# Patient Record
Sex: Female | Born: 2000 | Race: White | Hispanic: No | Marital: Single
Health system: Southern US, Community
[De-identification: ages and names within clinical notes are randomized; demographics above are authoritative.]

---

## 2020-01-24 ENCOUNTER — Encounter (HOSPITAL_BASED_OUTPATIENT_CLINIC_OR_DEPARTMENT_OTHER): Payer: Self-pay | Admitting: Emergency Medicine

## 2020-01-24 ENCOUNTER — Emergency Department (HOSPITAL_BASED_OUTPATIENT_CLINIC_OR_DEPARTMENT_OTHER)
Admission: EM | Admit: 2020-01-24 | Discharge: 2020-01-24 | Disposition: A | Discharge status: Home / Self Care | Payer: BLUE CROSS/BLUE SHIELD | Attending: Emergency Medicine | Admitting: Emergency Medicine

## 2020-01-24 ENCOUNTER — Emergency Department (HOSPITAL_BASED_OUTPATIENT_CLINIC_OR_DEPARTMENT_OTHER): Payer: BLUE CROSS/BLUE SHIELD

## 2020-01-24 ENCOUNTER — Other Ambulatory Visit: Payer: Self-pay

## 2020-01-24 DIAGNOSIS — R319 Hematuria, unspecified: Secondary | ICD-10-CM | POA: Diagnosis not present

## 2020-01-24 DIAGNOSIS — N201 Calculus of ureter: Secondary | ICD-10-CM | POA: Diagnosis not present

## 2020-01-24 DIAGNOSIS — D72829 Elevated white blood cell count, unspecified: Secondary | ICD-10-CM | POA: Insufficient documentation

## 2020-01-24 DIAGNOSIS — R112 Nausea with vomiting, unspecified: Secondary | ICD-10-CM | POA: Diagnosis not present

## 2020-01-24 DIAGNOSIS — R1031 Right lower quadrant pain: Secondary | ICD-10-CM | POA: Diagnosis present

## 2020-01-24 LAB — COMPREHENSIVE METABOLIC PANEL
ALT: 39 U/L (ref 0–44)
AST: 39 U/L (ref 15–41)
Albumin: 4.9 g/dL (ref 3.5–5.0)
Alkaline Phosphatase: 48 U/L (ref 38–126)
Anion gap: 13 (ref 5–15)
BUN: 16 mg/dL (ref 6–20)
CO2: 22 mmol/L (ref 22–32)
Calcium: 9.7 mg/dL (ref 8.9–10.3)
Chloride: 103 mmol/L (ref 98–111)
Creatinine, Ser: 0.96 mg/dL (ref 0.44–1.00)
GFR calc Af Amer: >60 mL/min (ref >60)
GFR calc non Af Amer: >60 mL/min (ref >60)
Glucose, Bld: 132 mg/dL — ABNORMAL HIGH (ref 70–99)
Potassium: 4.2 mmol/L (ref 3.5–5.1)
Sodium: 138 mmol/L (ref 135–145)
Total Bilirubin: 0.4 mg/dL (ref 0.3–1.2)
Total Protein: 8.1 g/dL (ref 6.5–8.1)

## 2020-01-24 LAB — CBC
HCT: 39.3 % (ref 36.0–46.0)
Hemoglobin: 12.6 g/dL (ref 12.0–15.0)
MCH: 28.6 pg (ref 26.0–34.0)
MCHC: 32.1 g/dL (ref 30.0–36.0)
MCV: 89.1 fL (ref 80.0–100.0)
Platelets: 164 10*3/uL (ref 150–400)
RBC: 4.41 MIL/uL (ref 3.87–5.11)
RDW: 13.3 % (ref 11.5–15.5)
WBC: 11.3 10*3/uL — ABNORMAL HIGH (ref 4.0–10.5)
nRBC: 0 % (ref 0.0–0.2)

## 2020-01-24 LAB — URINALYSIS, ROUTINE W REFLEX MICROSCOPIC
Bilirubin Urine: NEGATIVE
Glucose, UA: NEGATIVE mg/dL
Ketones, ur: 15 mg/dL — AB
Leukocytes,Ua: NEGATIVE
Nitrite: NEGATIVE
Protein, ur: 30 mg/dL — AB
Specific Gravity, Urine: 1.025 (ref 1.005–1.030)
pH: 7 (ref 5.0–8.0)

## 2020-01-24 LAB — URINALYSIS, MICROSCOPIC (REFLEX): RBC / HPF: >50 RBC/hpf (ref 0–5)

## 2020-01-24 LAB — PREGNANCY, URINE: Preg Test, Ur: NEGATIVE

## 2020-01-24 LAB — LIPASE, BLOOD: Lipase: 31 U/L (ref 11–51)

## 2020-01-24 MED ORDER — ONDANSETRON HCL 4 MG/2ML IJ SOLN
4.0000 mg | Freq: Once | INTRAMUSCULAR | Status: AC
  Administered 2020-01-24: 4 mg via INTRAVENOUS
  Filled 2020-01-24: qty 2

## 2020-01-24 MED ORDER — HYDROCODONE-ACETAMINOPHEN 5-325 MG PO TABS: 1.0000 | ORAL_TABLET | ORAL | 0 refills | Status: AC | PRN

## 2020-01-24 MED ORDER — ONDANSETRON 4 MG PO TBDP: 4.0000 mg | ORAL_TABLET | Freq: Three times a day (TID) | ORAL | 0 refills | Status: AC | PRN

## 2020-01-24 MED ORDER — KETOROLAC TROMETHAMINE 15 MG/ML IJ SOLN
15.0000 mg | Freq: Once | INTRAMUSCULAR | Status: AC
  Administered 2020-01-24: 15 mg via INTRAVENOUS
  Filled 2020-01-24: qty 1

## 2020-01-24 MED ORDER — MORPHINE SULFATE (PF) 4 MG/ML IV SOLN
4.0000 mg | Freq: Once | INTRAVENOUS | Status: DC
  Filled 2020-01-24: qty 1

## 2020-01-24 MED ORDER — SODIUM CHLORIDE 0.9 % IV BOLUS
500.0000 mL | Freq: Once | INTRAVENOUS | Status: AC
  Administered 2020-01-24: 500 mL via INTRAVENOUS

## 2020-01-24 MED ORDER — SODIUM CHLORIDE 0.9% FLUSH
3.0000 mL | Freq: Once | INTRAVENOUS | Status: DC
  Filled 2020-01-24: qty 3

## 2020-01-24 NOTE — ED Notes (Signed)
Pt states more comfortable following toradol, pain improved to 5/10, nausea improved

## 2020-01-24 NOTE — ED Provider Notes (Signed)
[facility] EMERGENCY DEPARTMENT Provider Note   CSN: [id number] Arrival date & time: 01/24/20  1319     History Chief Complaint  Patient presents with  . Abdominal Pain    Crystal Davis is a 19 y.o. female.  HPI 19 year old female presents with RLQ pain.  Started this morning and has been constant.  Seems to be worsening.  It is a sharp pain.  It goes into her right flank.  Vomited once this afternoon.  Tried to take ibuprofen but thinks she vomited it up.  No fevers, back pain, or urinary symptoms.  Pain is a 9/10.  History reviewed. No pertinent past medical history.  There are no problems to display for this patient.   History reviewed. No pertinent surgical history.   OB History   No obstetric history on file.     No family history on file.  Social History   Tobacco Use  . Smoking status: Not on file  Substance Use Topics  . Alcohol use: Not on file  . Drug use: Not on file    Home Medications Prior to Admission medications   Medication Sig Start Date End Date Taking? Authorizing Provider  HYDROcodone-acetaminophen (NORCO) 5-325 MG tablet Take 1 tablet by mouth every 4 (four) hours as needed for severe pain. 01/24/20   Pricilla Loveless, MD  ondansetron (ZOFRAN ODT) 4 MG disintegrating tablet Take 1 tablet (4 mg total) by mouth every 8 (eight) hours as needed. 01/24/20   Pricilla Loveless, MD    Allergies    Patient has no known allergies.  Review of Systems   Review of Systems  Constitutional: Negative for fever.  Gastrointestinal: Positive for abdominal pain, nausea and vomiting.  Genitourinary: Positive for flank pain. Negative for dysuria and hematuria.  Musculoskeletal: Negative for back pain.  All other systems reviewed and are negative.   Physical Exam Updated Vital Signs BP 114/76 (BP Location: Right Arm)   Pulse 77   Temp 98.5 F (36.9 C) (Oral)   Resp 16   Ht 5\' 2"  (1.575 m)   Wt 46.7 kg   LMP 01/11/2020 (Exact Date)   SpO2 95%    BMI 18.84 kg/m   Physical Exam Vitals and nursing note reviewed.  Constitutional:      General: She is not in acute distress.    Appearance: She is well-developed. She is not ill-appearing or diaphoretic.  HENT:     Head: Normocephalic and atraumatic.     Right Ear: External ear normal.     Left Ear: External ear normal.     Nose: Nose normal.  Eyes:     General:        Right eye: No discharge.        Left eye: No discharge.  Cardiovascular:     Rate and Rhythm: Normal rate and regular rhythm.     Heart sounds: Normal heart sounds.  Pulmonary:     Effort: Pulmonary effort is normal.     Breath sounds: Normal breath sounds.  Abdominal:     Palpations: Abdomen is soft.     Tenderness: There is no abdominal tenderness. There is right CVA tenderness.  Skin:    General: Skin is warm and dry.  Neurological:     Mental Status: She is alert.  Psychiatric:        Mood and Affect: Mood is not anxious.     ED Results / Procedures / Treatments   Labs (all labs ordered are  listed, but only abnormal results are displayed) Labs Reviewed  COMPREHENSIVE METABOLIC PANEL - Abnormal; Notable for the following components:      Result Value   Glucose, Bld 132 (*)    All other components within normal limits  CBC - Abnormal; Notable for the following components:   WBC 11.3 (*)    All other components within normal limits  URINALYSIS, ROUTINE W REFLEX MICROSCOPIC - Abnormal; Notable for the following components:   APPearance HAZY (*)    Hgb urine dipstick LARGE (*)    Ketones, ur 15 (*)    Protein, ur 30 (*)    All other components within normal limits  URINALYSIS, MICROSCOPIC (REFLEX) - Abnormal; Notable for the following components:   Bacteria, UA MANY (*)    All other components within normal limits  URINE CULTURE  LIPASE, BLOOD  PREGNANCY, URINE    EKG None  Radiology CT Renal Stone Study  Result Date: 01/24/2020 CLINICAL DATA:  Right lower quadrant pain EXAM: CT  ABDOMEN AND PELVIS WITHOUT CONTRAST TECHNIQUE: Multidetector CT imaging of the abdomen and pelvis was performed following the standard protocol without IV contrast. COMPARISON:  None. FINDINGS: Lower chest: No acute abnormality. Hepatobiliary: Possible sludge within the gallbladder. No calcified stone or biliary dilatation. Pancreas: Unremarkable. No pancreatic ductal dilatation or surrounding inflammatory changes. Spleen: Normal in size without focal abnormality. Adrenals/Urinary Tract: Adrenal glands are normal. Slight hyperdensity at the renal pyramids. Mild right hydronephrosis and hydroureter, suspected to be secondary to probable 2 mm stone in the distal right ureter about a cm proximal to the right UVJ. Urinary bladder otherwise unremarkable. Stomach/Bowel: Stomach is within normal limits. Appendix appears normal. No evidence of bowel wall thickening, distention, or inflammatory changes. Vascular/Lymphatic: No significant vascular findings are present. No enlarged abdominal or pelvic lymph nodes. Reproductive: Uterus and bilateral adnexa are unremarkable. Other: No free air.  Small free fluid in the pelvis Musculoskeletal: No acute or significant osseous findings. IMPRESSION: 1. Mild right hydronephrosis and hydroureter, suspected to be secondary to probable 2 mm stone in the distal right ureter about a cm proximal to the right UVJ. 2. Small free fluid in the pelvis. Electronically Signed   By: Jasmine Pang M.D.   On: 01/24/2020 16:38    Procedures Procedures (including critical care time)  Medications Ordered in ED Medications  sodium chloride flush (NS) 0.9 % injection 3 mL (3 mLs Intravenous Not Given 01/24/20 1806)  morphine 4 MG/ML injection 4 mg (0 mg Intravenous Hold 01/24/20 1756)  sodium chloride 0.9 % bolus 500 mL (500 mLs Intravenous New Bag/Given 01/24/20 1752)  ketorolac (TORADOL) 15 MG/ML injection 15 mg (15 mg Intravenous Given 01/24/20 1754)  ondansetron (ZOFRAN) injection 4 mg (4 mg  Intravenous Given 01/24/20 1752)    ED Course  I have reviewed the triage vital signs and the nursing notes.  Pertinent labs & imaging results that were available during my care of the patient were reviewed by me and considered in my medical decision making (see chart for details).    MDM Rules/Calculators/A&P                          Labs and CT scan have been personally reviewed.  Has hematuria and mild leukocytosis.  CT shows right distal ureteral stone.  This appears to be the source of her pain.  No appendicitis.  After IV Toradol and Zofran she is feeling much better and her pain is now  closer to a 3 instead of a 9.  Advised NSAIDs and Tylenol for pain control and will give Norco for breakthrough.  Otherwise will refer to urology. Final Clinical Impression(s) / ED Diagnoses Final diagnoses:  Right ureteral stone    Rx / DC Orders ED Discharge Orders         Ordered    HYDROcodone-acetaminophen (NORCO) 5-325 MG tablet  Every 4 hours PRN     Discontinue  Reprint     01/24/20 1843    ondansetron (ZOFRAN ODT) 4 MG disintegrating tablet  Every 8 hours PRN     Discontinue  Reprint     01/24/20 1910           Pricilla Loveless, MD 01/24/20 1921

## 2020-01-24 NOTE — Discharge Instructions (Addendum)
If you develop fever, vomiting, uncontrolled pain or any new/worsening symptoms then return to the ER for evaluation

## 2020-01-24 NOTE — ED Notes (Signed)
Pt and mom wish to try toradol and zofran and hold morphine until re-evaluate pain.

## 2020-01-24 NOTE — ED Triage Notes (Addendum)
PT presents with c/o RLQ pain since she woke up this morning. . Pt states she feels nauseous but no vomiting. PT ate breakfast around 0930 this morning without vomiting. PT states when she bends over while sitting in chair, it helps pain a little.

## 2020-01-25 LAB — URINE CULTURE: Culture: <10000 — AB

## 2021-12-03 IMAGING — CT CT RENAL STONE PROTOCOL
2 of 4 series · 17 of 46 positions shown, 19 images · non-contrast
Comparison: None.

CLINICAL DATA: Right lower quadrant pain

EXAM:
CT ABDOMEN AND PELVIS WITHOUT CONTRAST
TECHNIQUE: Multidetector CT imaging of the abdomen and pelvis was performed
following the standard protocol without IV contrast.

[Series 2: axial st · axial · 0.68mm/px · z∈[+414,+784]mm · 14 of 82 slices shown, 16 images]
[im 4/82  soft-tissue]
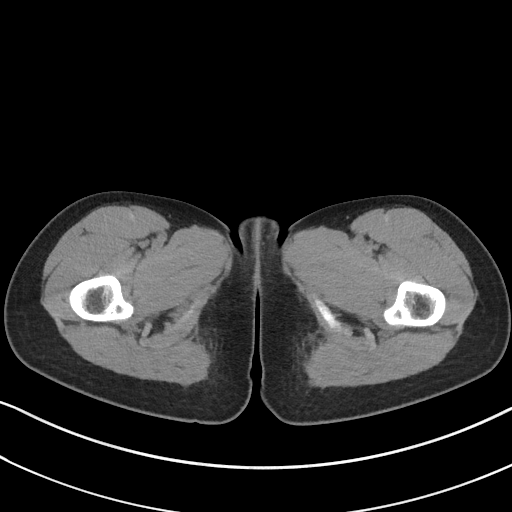
[im 4/82  bone]
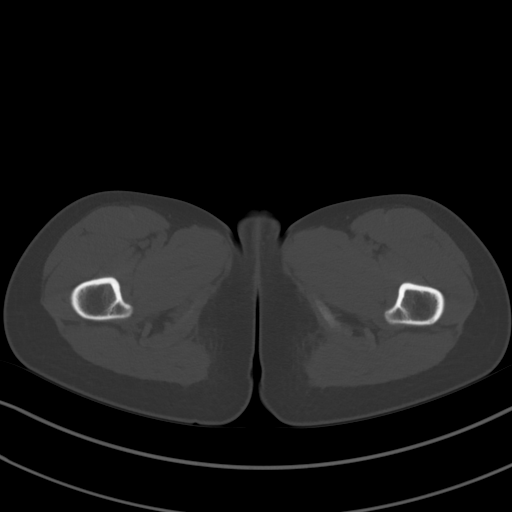
[im 10/82  soft-tissue]
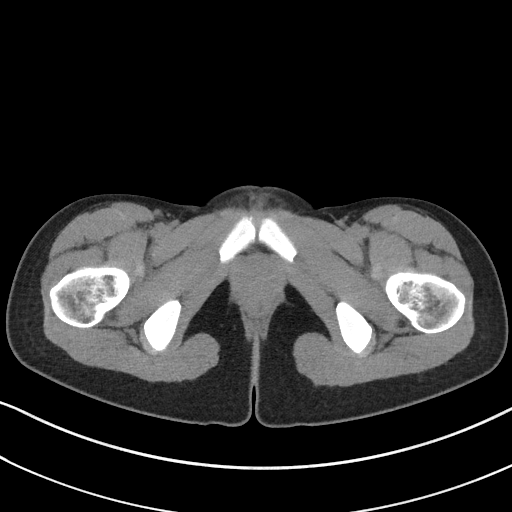
[im 16/82  soft-tissue]
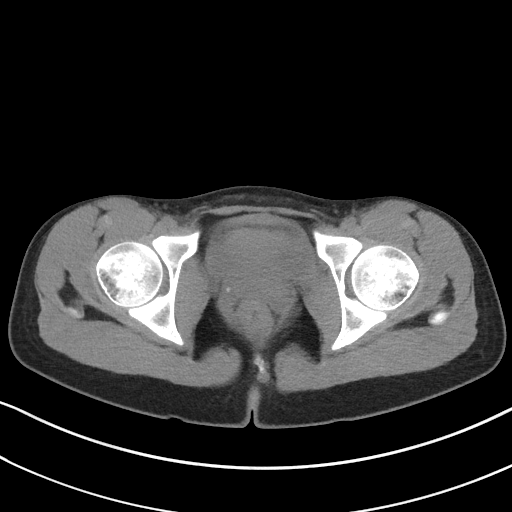
[im 22/82  soft-tissue]
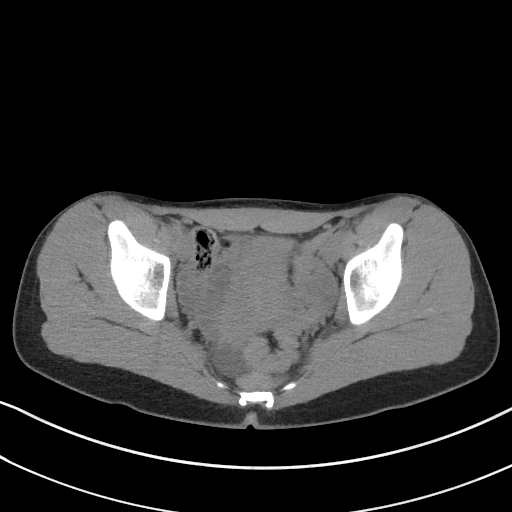
[im 29/82  soft-tissue]
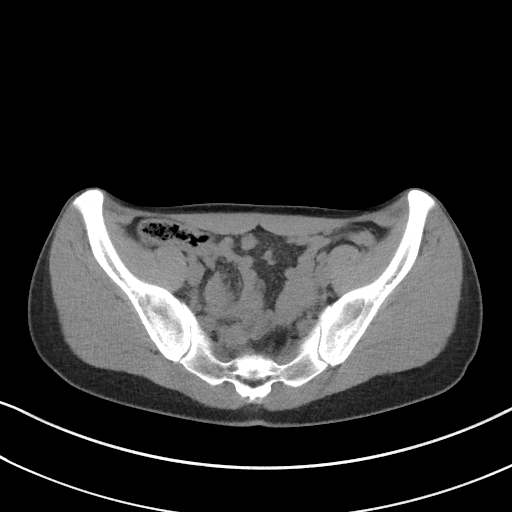
[im 32/82  soft-tissue]
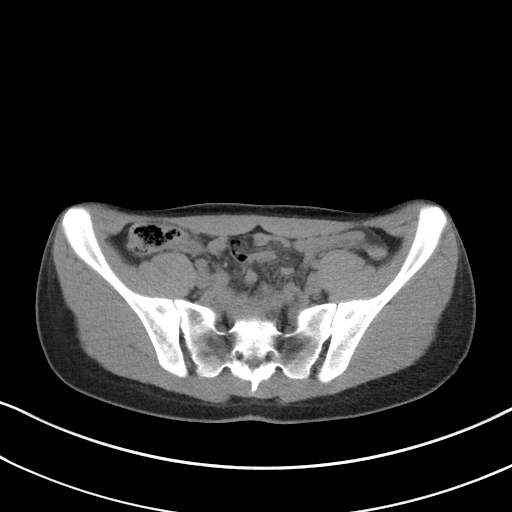
[im 38/82  soft-tissue]
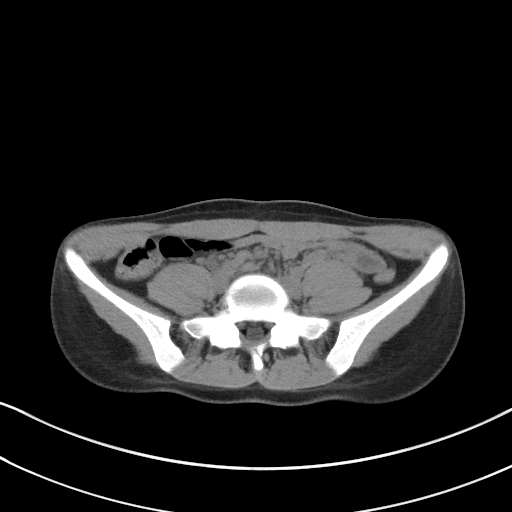
[im 44/82  soft-tissue]
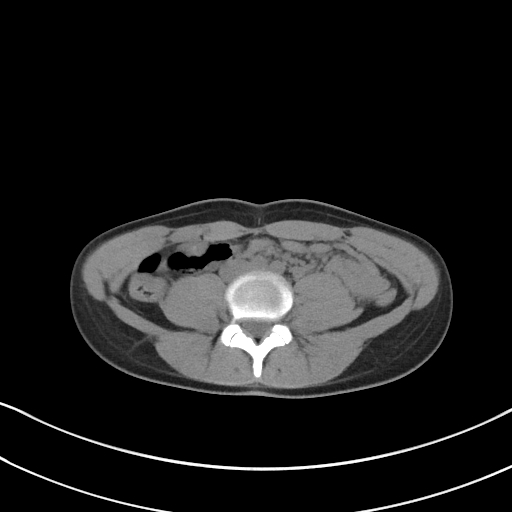
[im 50/82  soft-tissue]
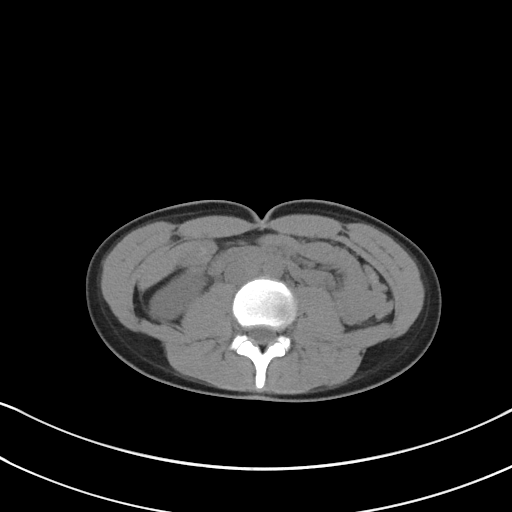
[im 50/82  bone]
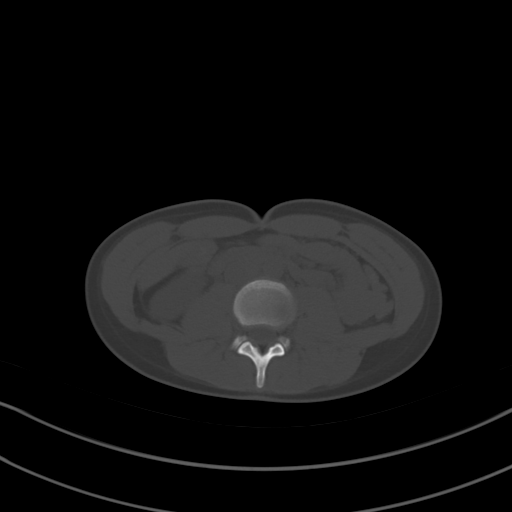
[im 53/82  soft-tissue]
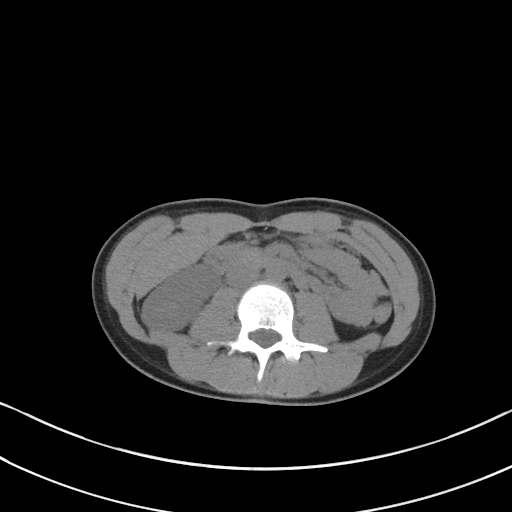
[im 60/82  soft-tissue]
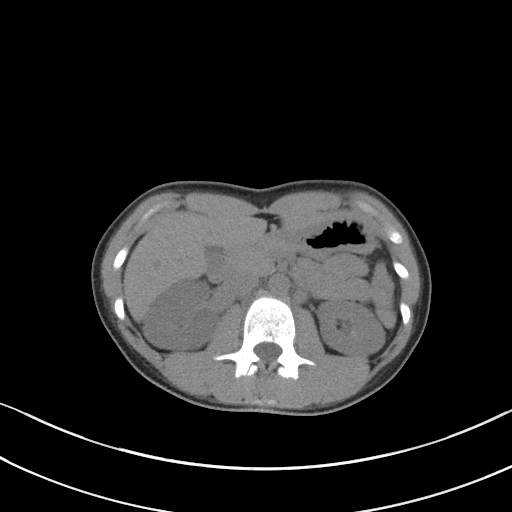
[im 66/82  soft-tissue]
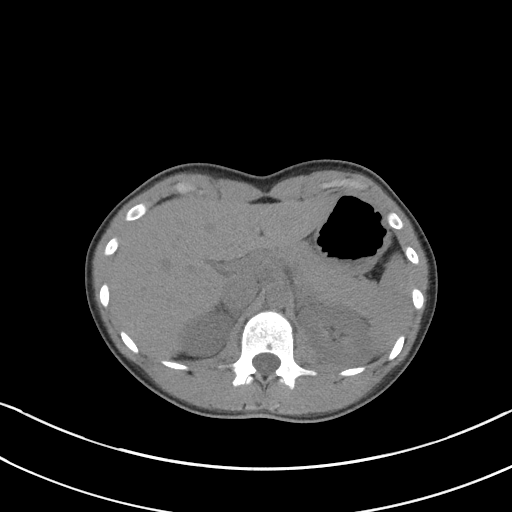
[im 72/82  soft-tissue]
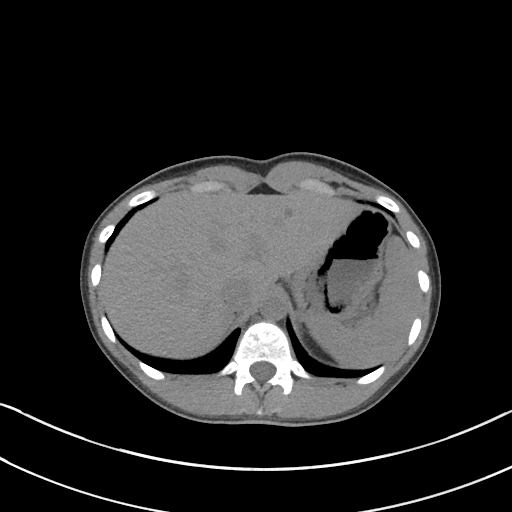
[im 78/82  soft-tissue]
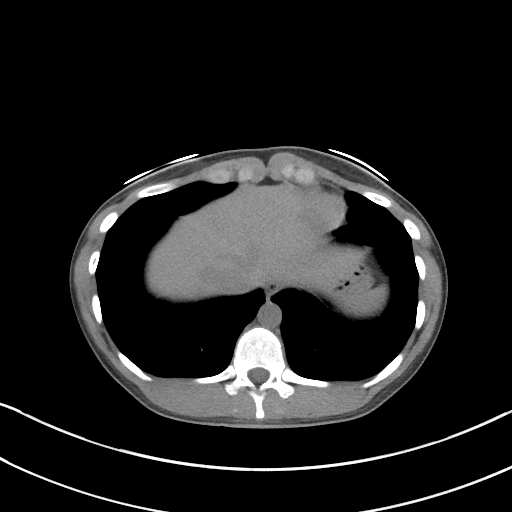

[Series 5: coronal st · coronal · 0.70mm/px · 3 of 67 slices shown]
[im 23/67  soft-tissue]
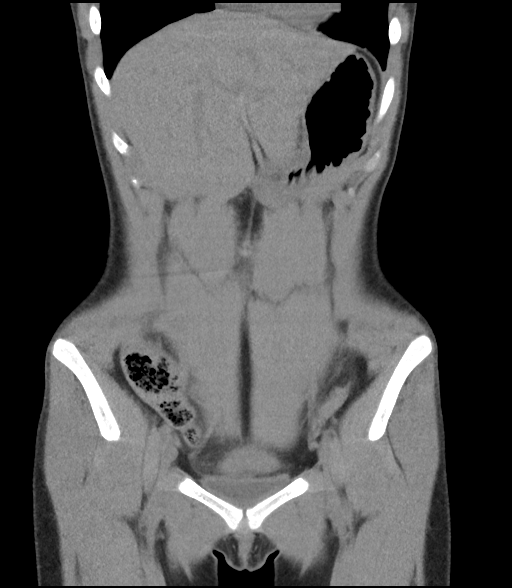
[im 30/67  soft-tissue]
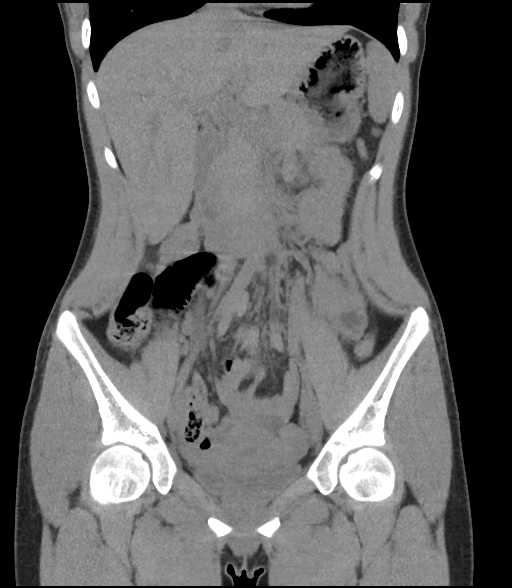
[im 37/67  soft-tissue]
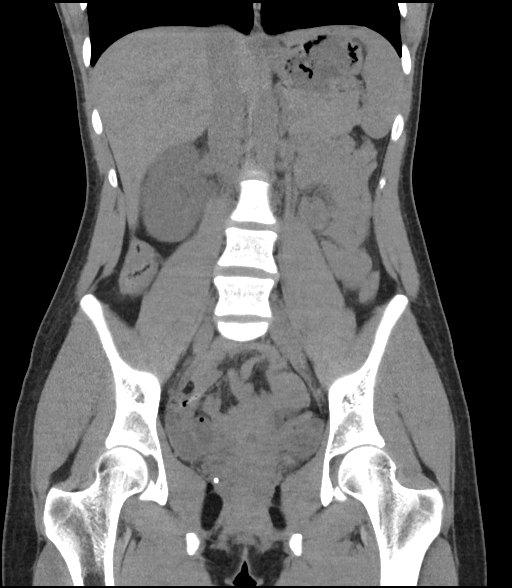

[17 of 46 positions shown; findings below may reference images not displayed]

FINDINGS: Lower chest: No acute abnormality.

Hepatobiliary: Possible sludge within the gallbladder. No calcified
stone or biliary dilatation.

Pancreas: Unremarkable. No pancreatic ductal dilatation or
surrounding inflammatory changes.

Spleen: Normal in size without focal abnormality.

Adrenals/Urinary Tract: Adrenal glands are normal. Slight
hyperdensity at the renal pyramids. Mild right hydronephrosis and
hydroureter, suspected to be secondary to probable 2 mm stone in the
distal right ureter about a cm proximal to the right UVJ. Urinary
bladder otherwise unremarkable.

Stomach/Bowel: Stomach is within normal limits. Appendix appears
normal. No evidence of bowel wall thickening, distention, or
inflammatory changes.

Vascular/Lymphatic: No significant vascular findings are present. No
enlarged abdominal or pelvic lymph nodes.

Reproductive: Uterus and bilateral adnexa are unremarkable.

Other: No free air.  Small free fluid in the pelvis

Musculoskeletal: No acute or significant osseous findings.
IMPRESSION: 1. Mild right hydronephrosis and hydroureter, suspected to be
secondary to probable 2 mm stone in the distal right ureter about a
cm proximal to the right UVJ.
2. Small free fluid in the pelvis.
